# Patient Record
Sex: Male | Born: 1985 | Race: White | Hispanic: No | Marital: Married | Smoking: Current every day smoker
Health system: Southern US, Community
[De-identification: ages and names within clinical notes are randomized; demographics above are authoritative.]

## PROBLEM LIST (undated history)

## (undated) HISTORY — PX: KNEE ARTHROSCOPY: SUR90

---

## 2008-11-20 ENCOUNTER — Encounter: Admission: RE | Admit: 2008-11-20 | Discharge: 2008-11-20 | Payer: Self-pay | Admitting: Occupational Medicine

## 2015-01-18 ENCOUNTER — Encounter (HOSPITAL_COMMUNITY): Payer: Self-pay | Admitting: *Deleted

## 2015-01-18 ENCOUNTER — Emergency Department (HOSPITAL_COMMUNITY)
Admission: EM | Admit: 2015-01-18 | Discharge: 2015-01-18 | Disposition: A | Discharge status: Home / Self Care | Payer: BLUE CROSS/BLUE SHIELD | Attending: Emergency Medicine | Admitting: Emergency Medicine

## 2015-01-18 ENCOUNTER — Emergency Department (HOSPITAL_COMMUNITY): Payer: BLUE CROSS/BLUE SHIELD

## 2015-01-18 DIAGNOSIS — R519 Headache, unspecified: Secondary | ICD-10-CM

## 2015-01-18 DIAGNOSIS — R42 Dizziness and giddiness: Secondary | ICD-10-CM | POA: Insufficient documentation

## 2015-01-18 DIAGNOSIS — Z720 Tobacco use: Secondary | ICD-10-CM | POA: Diagnosis not present

## 2015-01-18 DIAGNOSIS — F419 Anxiety disorder, unspecified: Secondary | ICD-10-CM | POA: Diagnosis not present

## 2015-01-18 DIAGNOSIS — R51 Headache: Secondary | ICD-10-CM | POA: Diagnosis not present

## 2015-01-18 NOTE — Discharge Instructions (Signed)
Dizziness °Dizziness is a common problem. It is a feeling of unsteadiness or light-headedness. You may feel like you are about to faint. Dizziness can lead to injury if you stumble or fall. Anyone can become dizzy, but dizziness is more common in older adults. This condition can be caused by a number of things, including medicines, dehydration, or illness. °HOME CARE INSTRUCTIONS °Taking these steps may help with your condition: °Eating and Drinking °· Drink enough fluid to keep your urine clear or pale yellow. This helps to keep you from becoming dehydrated. Try to drink more clear fluids, such as water. °· Do not drink alcohol. °· Limit your caffeine intake if directed by your health care provider. °· Limit your salt intake if directed by your health care provider. °Activity °· Avoid making quick movements. °¨ Rise slowly from chairs and steady yourself until you feel okay. °¨ In the morning, first sit up on the side of the bed. When you feel okay, stand slowly while you hold onto something until you know that your balance is fine. °· Move your legs often if you need to stand in one place for a long time. Tighten and relax your muscles in your legs while you are standing. °· Do not drive or operate heavy machinery if you feel dizzy. °· Avoid bending down if you feel dizzy. Place items in your home so that they are easy for you to reach without leaning over. °Lifestyle °· Do not use any tobacco products, including cigarettes, chewing tobacco, or electronic cigarettes. If you need help quitting, ask your health care provider. °· Try to reduce your stress level, such as with yoga or meditation. Talk with your health care provider if you need help. °General Instructions °· Watch your dizziness for any changes. °· Take medicines only as directed by your health care provider. Talk with your health care provider if you think that your dizziness is caused by a medicine that you are taking. °· Tell a friend or a family  member that you are feeling dizzy. If he or she notices any changes in your behavior, have this person call your health care provider. °· Keep all follow-up visits as directed by your health care provider. This is important. °SEEK MEDICAL CARE IF: °· Your dizziness does not go away. °· Your dizziness or light-headedness gets worse. °· You feel nauseous. °· You have reduced hearing. °· You have new symptoms. °· You are unsteady on your feet or you feel like the room is spinning. °SEEK IMMEDIATE MEDICAL CARE IF: °· You vomit or have diarrhea and are unable to eat or drink anything. °· You have problems talking, walking, swallowing, or using your arms, hands, or legs. °· You feel generally weak. °· You are not thinking clearly or you have trouble forming sentences. It may take a friend or family member to notice this. °· You have chest pain, abdominal pain, shortness of breath, or sweating. °· Your vision changes. °· You notice any bleeding. °· You have a headache. °· You have neck pain or a stiff neck. °· You have a fever. °  °This information is not intended to replace advice given to you by your health care provider. Make sure you discuss any questions you have with your health care provider. °  °Document Released: 09/22/2000 Document Revised: 08/13/2014 Document Reviewed: 03/25/2014 °Elsevier Interactive Patient Education ©2016 [employer] °General Headache Without Cause °A headache is pain or discomfort felt around the head or neck area. The specific   cause of a headache may not be found. There are many causes and types of headaches. A few common ones are: °· Tension headaches. °· Migraine headaches. °· Cluster headaches. °· Chronic daily headaches. °HOME CARE INSTRUCTIONS  °Watch your condition for any changes. Take these steps to help with your condition: °Managing Pain °· Take over-the-counter and prescription medicines only as told by your health care provider. °· Lie down in a dark, quiet room when you have  a headache. °· If directed, apply ice to the head and neck area: °¨ Put ice in a plastic bag. °¨ Place a towel between your skin and the bag. °¨ Leave the ice on for 20 minutes, 2-3 times per day. °· Use a heating pad or hot shower to apply heat to the head and neck area as told by your health care provider. °· Keep lights dim if bright lights bother you or make your headaches worse. °Eating and Drinking °· Eat meals on a regular schedule. °· Limit alcohol use. °· Decrease the amount of caffeine you drink, or stop drinking caffeine. °General Instructions °· Keep all follow-up visits as told by your health care provider. This is important. °· Keep a headache journal to help find out what may trigger your headaches. For example, write down: °¨ What you eat and drink. °¨ How much sleep you get. °¨ Any change to your diet or medicines. °· Try massage or other relaxation techniques. °· Limit stress. °· Sit up straight, and do not tense your muscles. °· Do not use tobacco products, including cigarettes, chewing tobacco, or e-cigarettes. If you need help quitting, ask your health care provider. °· Exercise regularly as told by your health care provider. °· Sleep on a regular schedule. Get 7-9 hours of sleep, or the amount recommended by your health care provider. °SEEK MEDICAL CARE IF:  °· Your symptoms are not helped by medicine. °· You have a headache that is different from the usual headache. °· You have nausea or you vomit. °· You have a fever. °SEEK IMMEDIATE MEDICAL CARE IF:  °· Your headache becomes severe. °· You have repeated vomiting. °· You have a stiff neck. °· You have a loss of vision. °· You have problems with speech. °· You have pain in the eye or ear. °· You have muscular weakness or loss of muscle control. °· You lose your balance or have trouble walking. °· You feel faint or pass out. °· You have confusion. °  °This information is not intended to replace advice given to you by your health care provider.  Make sure you discuss any questions you have with your health care provider. °  °Document Released: 03/29/2005 Document Revised: 12/18/2014 Document Reviewed: 07/22/2014 °Elsevier Interactive Patient Education ©2016 [employer] ° °

## 2015-01-18 NOTE — ED Notes (Signed)
Pt reports starting Wed he developed dizziness, tightness in his head, temperol area sore, nausea, photophobia and some nausea. Pt is having difficulty focusing, did voice concern regarding wedding next weekend.

## 2015-01-18 NOTE — ED Provider Notes (Signed)
CSN: [id number]     Arrival date & time 01/18/15  1329 History   First MD Initiated Contact with Patient 01/18/15 1501     Chief Complaint  Patient presents with  . Dizziness  . photobia   . Anxiety     (Consider location/radiation/quality/duration/timing/severity/associated sxs/prior Treatment) HPI   29 year old male with multiple complaints. Complaining of headache. Describes a soreness in bilateral temporal region. Associated with intermittent dizziness. Describes sensation as if he may pass out. He feels like he is in a fog. Some difficulty with paying attention. Mild loss appear no vomiting. Some photophobia. No acute visual changes otherwise. No history of similar type symptoms. He denies any trauma. No blood thinners. Has tried taking ibuprofen with some mild relief. Otherwise healthy.  History reviewed. No pertinent past medical history. Past Surgical History  Procedure Laterality Date  . Knee arthroscopy     No family history on file. Social History  Substance Use Topics  . Smoking status: Current Every Day Smoker  . Smokeless tobacco: None  . Alcohol Use: Yes    Review of Systems  All systems reviewed and negative, other than as noted in HPI.   Allergies  Poison ivy extract and Bactrim  Home Medications   Prior to Admission medications   Medication Sig Start Date End Date Taking? Authorizing Provider  ibuprofen (ADVIL,MOTRIN) 200 MG tablet Take 400 mg by mouth every 6 (six) hours as needed for fever, headache, mild pain, moderate pain or cramping.   Yes Historical Provider, MD  naproxen sodium (ANAPROX) 220 MG tablet Take 220 mg by mouth every 12 (twelve) hours as needed (for pain).   Yes Historical Provider, MD   BP 126/74 mmHg  Pulse 68  Temp(Src) 97.9 F (36.6 C) (Oral)  Resp 20  SpO2 96% Physical Exam  Constitutional: He is oriented to person, place, and time. He appears well-developed and well-nourished. No distress.  HENT:  Head: Normocephalic and  atraumatic.  Eyes: Conjunctivae and EOM are normal. Pupils are equal, round, and reactive to light. Right eye exhibits no discharge. Left eye exhibits no discharge.  Neck: Normal range of motion. Neck supple.  No nuchal rigidity  Cardiovascular: Normal rate, regular rhythm and normal heart sounds.  Exam reveals no gallop and no friction rub.   No murmur heard. Pulmonary/Chest: Effort normal and breath sounds normal. No respiratory distress.  Abdominal: Soft. He exhibits no distension. There is no tenderness.  Musculoskeletal: He exhibits no edema or tenderness.  Neurological: He is alert and oriented to person, place, and time. No cranial nerve deficit. He exhibits normal muscle tone. Coordination normal.  Speech clear. Content appropriate. Good finger nose testing bilaterally. Gait steady.  Skin: Skin is warm and dry. He is not diaphoretic.  Psychiatric: He has a normal mood and affect. His behavior is normal. Thought content normal.  Nursing note and vitals reviewed.   ED Course  Procedures (including critical care time) Labs Review Labs Reviewed - No data to display  Imaging Review Ct Head Wo Contrast  01/18/2015   CLINICAL DATA:  Three days get developed dizziness and lightheaded feeling with nausea and photophobia.  EXAM: CT HEAD WITHOUT CONTRAST  TECHNIQUE: Contiguous axial images were obtained from the base of the skull through the vertex without intravenous contrast.  COMPARISON:  None.  FINDINGS: The brain has a normal appearance without evidence of malformation, atrophy, old or acute infarction, mass lesion, hemorrhage, hydrocephalus or extra-axial collection. The calvarium is unremarkable. The paranasal sinuses, middle ears  and mastoids are clear.  IMPRESSION: Normal head CT   Electronically Signed   By: Paulina Fusi M.D.   On: 01/18/2015 16:24   I have personally reviewed and evaluated these images and lab results as part of my medical decision-making.   EKG  Interpretation   Date/Time:  Saturday January 18 2015 13:38:36 EDT Ventricular Rate:  71 PR Interval:  190 QRS Duration: 87 QT Interval:  386 QTC Calculation: 419 R Axis:   96 Text Interpretation:  Sinus rhythm Borderline right axis deviation  Baseline wander in lead(s) V2 No old tracing to compare Confirmed by Kaicee Scarpino   MD, Roslyn Else ([id number]) on 01/18/2015 3:05:32 PM      MDM   Final diagnoses:  Nonintractable headache, unspecified chronicity pattern, unspecified headache type  Dizziness and giddiness    29yM with headache and several vague symptoms. Does not clearly fit common headache pattern. Actually currently symptom free. Nonfocal neuro exam. Normal head CT. Getting married in next week. May be some component of stress/anxiety. Low suspicion for emergent process such as bleed, mass, infectious, CO poisoning, glaucoma, etc. It has been determined that no acute conditions requiring further emergency intervention are present at this time. The patient has been advised of the diagnosis and plan. I reviewed any labs and imaging including any potential incidental findings. We have discussed signs and symptoms that warrant return to the ED and they are listed in the discharge instructions.      Raeford Razor, MD 01/23/15 1110

## 2015-01-18 NOTE — ED Notes (Signed)
Pt calm during assessment, states [facility] and [facility] could not figure out what is wrong.

## 2015-10-15 DIAGNOSIS — J029 Acute pharyngitis, unspecified: Secondary | ICD-10-CM | POA: Diagnosis not present

## 2015-10-15 DIAGNOSIS — J018 Other acute sinusitis: Secondary | ICD-10-CM | POA: Diagnosis not present

## 2015-10-21 DIAGNOSIS — M9902 Segmental and somatic dysfunction of thoracic region: Secondary | ICD-10-CM | POA: Diagnosis not present

## 2015-10-21 DIAGNOSIS — M9903 Segmental and somatic dysfunction of lumbar region: Secondary | ICD-10-CM | POA: Diagnosis not present

## 2015-10-21 DIAGNOSIS — M9901 Segmental and somatic dysfunction of cervical region: Secondary | ICD-10-CM | POA: Diagnosis not present

## 2015-10-21 DIAGNOSIS — M542 Cervicalgia: Secondary | ICD-10-CM | POA: Diagnosis not present

## 2015-11-18 DIAGNOSIS — M542 Cervicalgia: Secondary | ICD-10-CM | POA: Diagnosis not present

## 2015-11-18 DIAGNOSIS — M9902 Segmental and somatic dysfunction of thoracic region: Secondary | ICD-10-CM | POA: Diagnosis not present

## 2015-11-18 DIAGNOSIS — M9901 Segmental and somatic dysfunction of cervical region: Secondary | ICD-10-CM | POA: Diagnosis not present

## 2015-11-18 DIAGNOSIS — M9903 Segmental and somatic dysfunction of lumbar region: Secondary | ICD-10-CM | POA: Diagnosis not present

## 2015-12-23 DIAGNOSIS — M9902 Segmental and somatic dysfunction of thoracic region: Secondary | ICD-10-CM | POA: Diagnosis not present

## 2015-12-23 DIAGNOSIS — M9903 Segmental and somatic dysfunction of lumbar region: Secondary | ICD-10-CM | POA: Diagnosis not present

## 2015-12-23 DIAGNOSIS — M9901 Segmental and somatic dysfunction of cervical region: Secondary | ICD-10-CM | POA: Diagnosis not present

## 2015-12-23 DIAGNOSIS — M542 Cervicalgia: Secondary | ICD-10-CM | POA: Diagnosis not present

## 2016-01-27 DIAGNOSIS — M9901 Segmental and somatic dysfunction of cervical region: Secondary | ICD-10-CM | POA: Diagnosis not present

## 2016-01-27 DIAGNOSIS — M9902 Segmental and somatic dysfunction of thoracic region: Secondary | ICD-10-CM | POA: Diagnosis not present

## 2016-01-27 DIAGNOSIS — M9903 Segmental and somatic dysfunction of lumbar region: Secondary | ICD-10-CM | POA: Diagnosis not present

## 2016-01-27 DIAGNOSIS — M542 Cervicalgia: Secondary | ICD-10-CM | POA: Diagnosis not present

## 2016-02-17 DIAGNOSIS — M533 Sacrococcygeal disorders, not elsewhere classified: Secondary | ICD-10-CM | POA: Diagnosis not present

## 2016-02-26 DIAGNOSIS — M9903 Segmental and somatic dysfunction of lumbar region: Secondary | ICD-10-CM | POA: Diagnosis not present

## 2016-02-26 DIAGNOSIS — M9901 Segmental and somatic dysfunction of cervical region: Secondary | ICD-10-CM | POA: Diagnosis not present

## 2016-02-26 DIAGNOSIS — M542 Cervicalgia: Secondary | ICD-10-CM | POA: Diagnosis not present

## 2016-02-26 DIAGNOSIS — M9902 Segmental and somatic dysfunction of thoracic region: Secondary | ICD-10-CM | POA: Diagnosis not present

## 2016-07-31 IMAGING — CT CT HEAD W/O CM
2 series · 17 of 30 positions shown, 20 images · non-contrast
Comparison: None.

CLINICAL DATA: Three days get developed dizziness and lightheaded
feeling with nausea and photophobia.

EXAM:
CT HEAD WITHOUT CONTRAST
TECHNIQUE: Contiguous axial images were obtained from the base of the skull
through the vertex without intravenous contrast.

[Series 2: head w/o · axial · non-contrast · 0.45mm/px · z∈[-131,-11]mm · 9 of 31 slices shown, 12 images]
[im 4/31  brain]
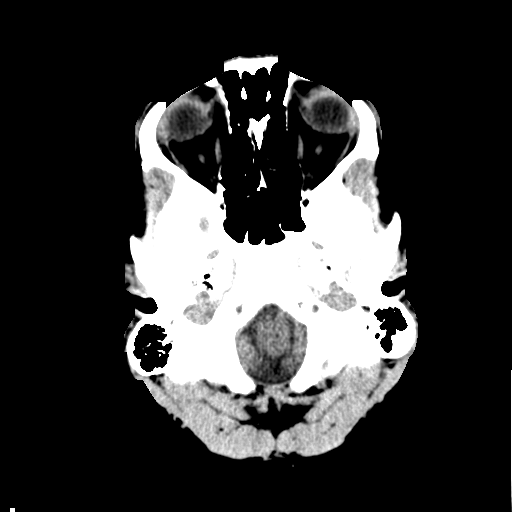
[im 4/31  bone]
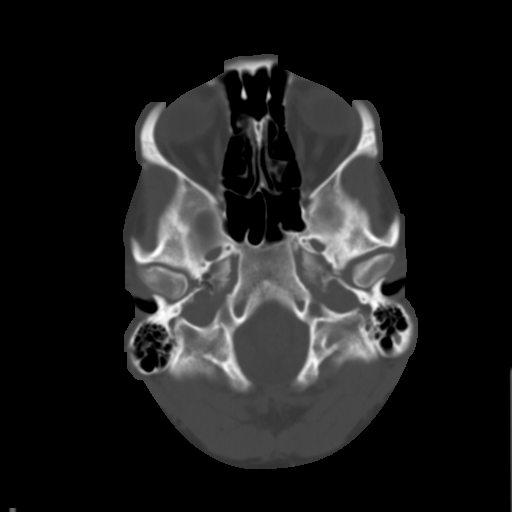
[im 7/31  brain]
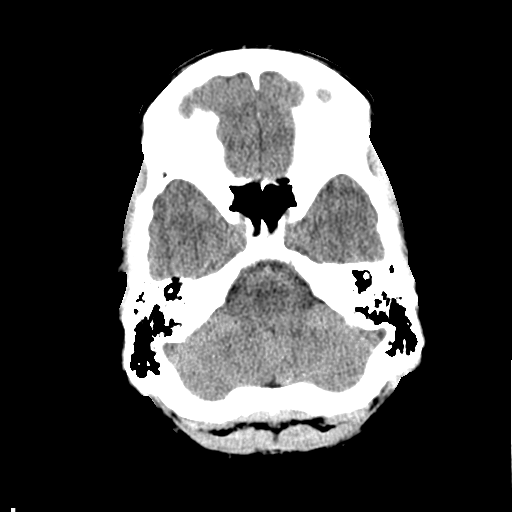
[im 10/31  brain]
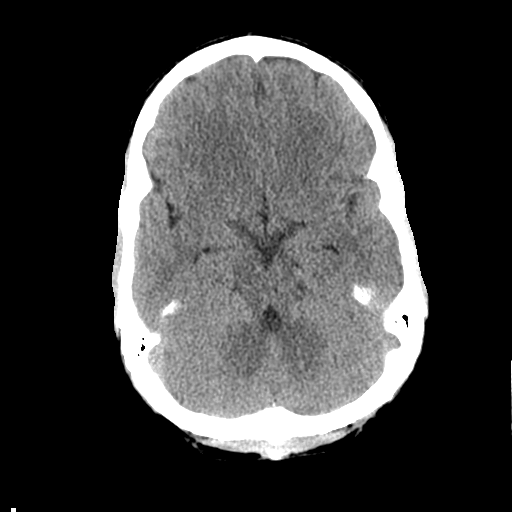
[im 13/31  brain]
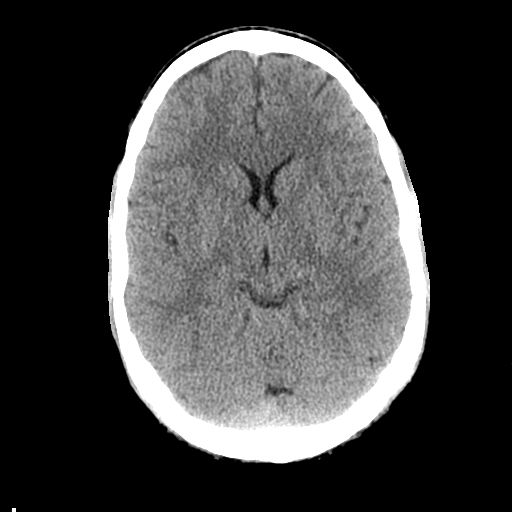
[im 16/31  brain]
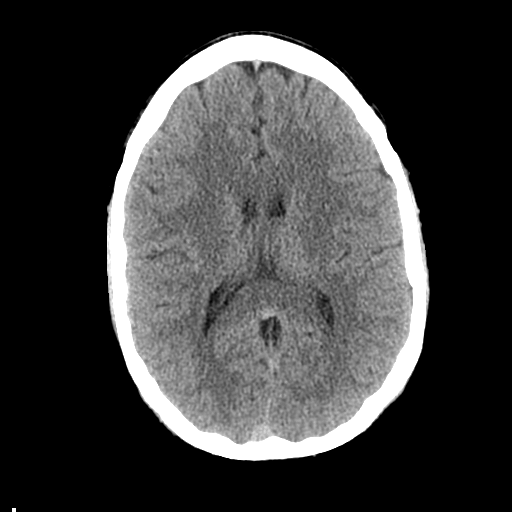
[im 16/31  bone]
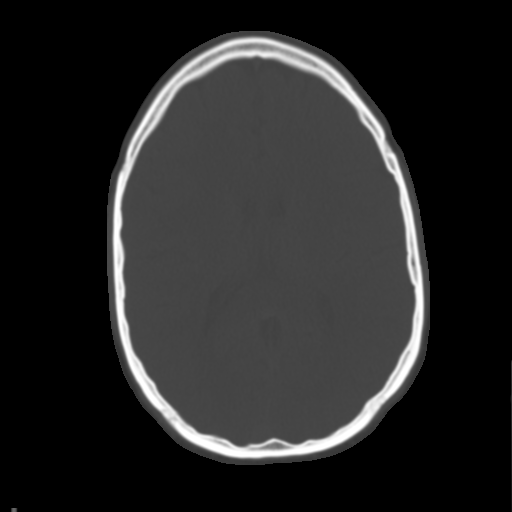
[im 19/31  brain]
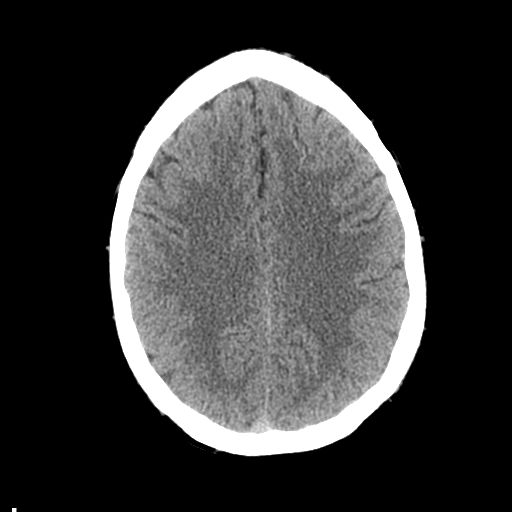
[im 22/31  brain]
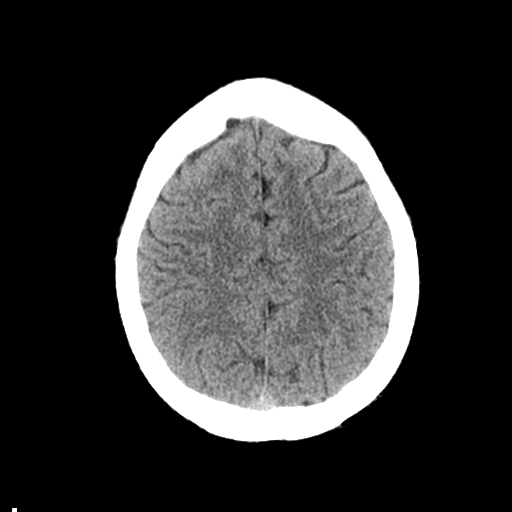
[im 25/31  brain]
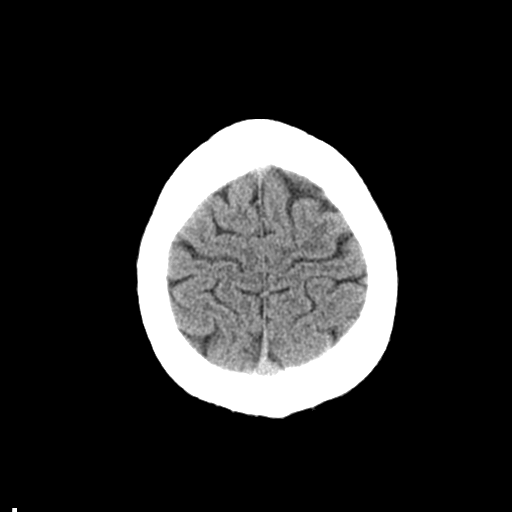
[im 28/31  brain]
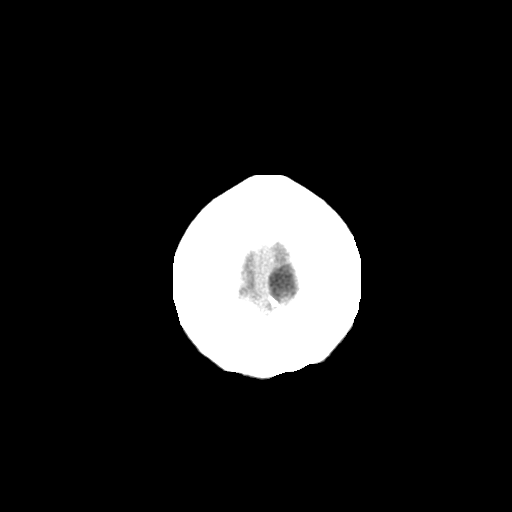
[im 28/31  bone]
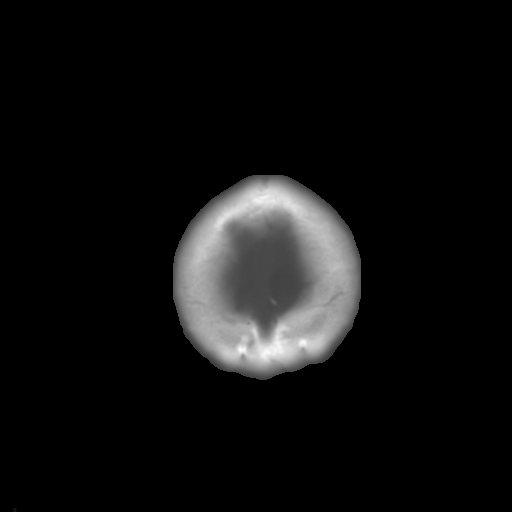

[Series 3: bone windows · axial · 0.45mm/px · z∈[-131,-14]mm · 8 of 51 slices shown]
[im 6/51  bone]
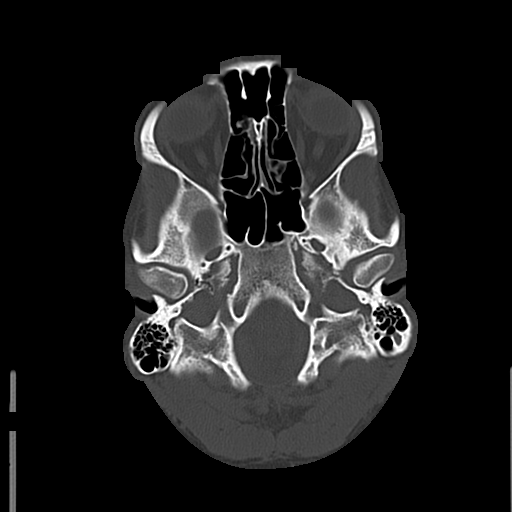
[im 12/51  bone]
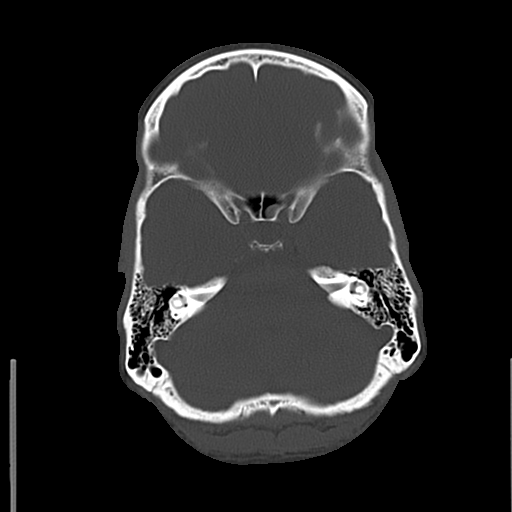
[im 17/51  bone]
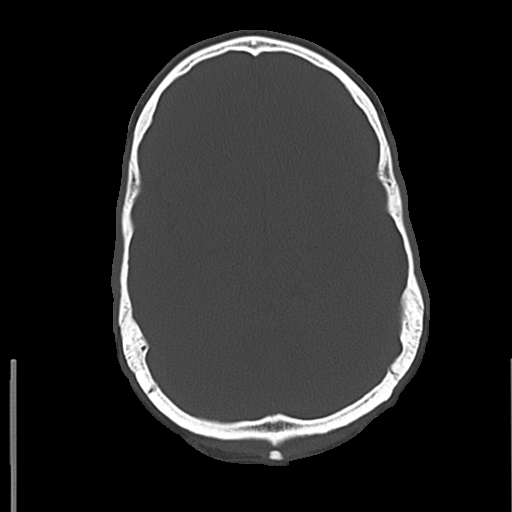
[im 23/51  bone]
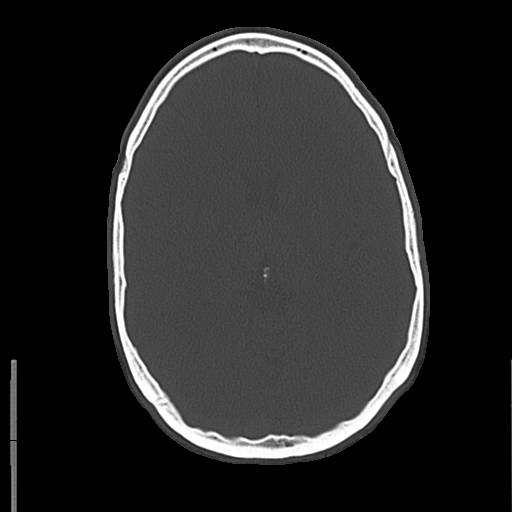
[im 28/51  bone]
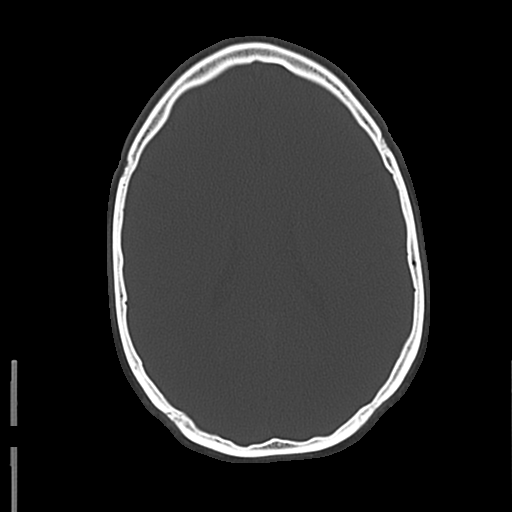
[im 34/51  bone]
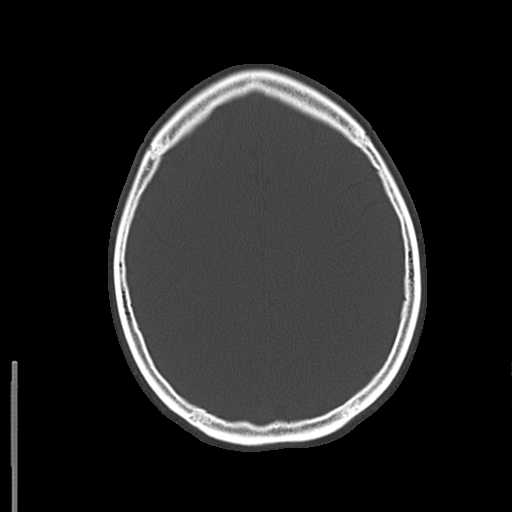
[im 39/51  bone]
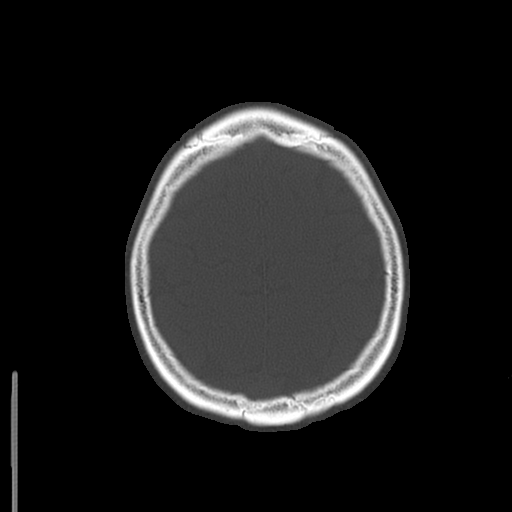
[im 45/51  bone]
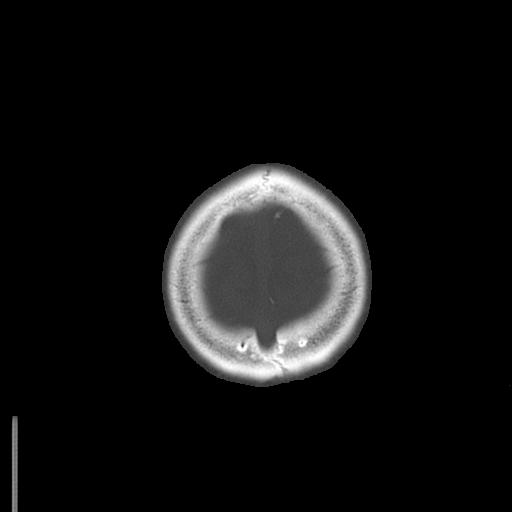

[17 of 30 positions shown; findings below may reference images not displayed]

FINDINGS: The brain has a normal appearance without evidence of malformation,
atrophy, old or acute infarction, mass lesion, hemorrhage,
hydrocephalus or extra-axial collection. The calvarium is
unremarkable. The paranasal sinuses, middle ears and mastoids are
clear.
IMPRESSION: Normal head CT

## 2016-08-31 DIAGNOSIS — M9903 Segmental and somatic dysfunction of lumbar region: Secondary | ICD-10-CM | POA: Diagnosis not present

## 2016-08-31 DIAGNOSIS — S76101A Unspecified injury of right quadriceps muscle, fascia and tendon, initial encounter: Secondary | ICD-10-CM | POA: Diagnosis not present

## 2016-08-31 DIAGNOSIS — M791 Myalgia: Secondary | ICD-10-CM | POA: Diagnosis not present

## 2016-08-31 DIAGNOSIS — M9905 Segmental and somatic dysfunction of pelvic region: Secondary | ICD-10-CM | POA: Diagnosis not present

## 2016-08-31 DIAGNOSIS — M9902 Segmental and somatic dysfunction of thoracic region: Secondary | ICD-10-CM | POA: Diagnosis not present

## 2016-09-03 DIAGNOSIS — R1031 Right lower quadrant pain: Secondary | ICD-10-CM | POA: Diagnosis not present

## 2016-09-07 DIAGNOSIS — S76101A Unspecified injury of right quadriceps muscle, fascia and tendon, initial encounter: Secondary | ICD-10-CM | POA: Diagnosis not present

## 2016-09-07 DIAGNOSIS — M9902 Segmental and somatic dysfunction of thoracic region: Secondary | ICD-10-CM | POA: Diagnosis not present

## 2016-09-07 DIAGNOSIS — M9905 Segmental and somatic dysfunction of pelvic region: Secondary | ICD-10-CM | POA: Diagnosis not present

## 2016-09-07 DIAGNOSIS — M791 Myalgia: Secondary | ICD-10-CM | POA: Diagnosis not present

## 2016-09-07 DIAGNOSIS — M9903 Segmental and somatic dysfunction of lumbar region: Secondary | ICD-10-CM | POA: Diagnosis not present

## 2016-09-14 DIAGNOSIS — M9902 Segmental and somatic dysfunction of thoracic region: Secondary | ICD-10-CM | POA: Diagnosis not present

## 2016-09-14 DIAGNOSIS — M791 Myalgia: Secondary | ICD-10-CM | POA: Diagnosis not present

## 2016-09-14 DIAGNOSIS — M9903 Segmental and somatic dysfunction of lumbar region: Secondary | ICD-10-CM | POA: Diagnosis not present

## 2016-09-14 DIAGNOSIS — S76101A Unspecified injury of right quadriceps muscle, fascia and tendon, initial encounter: Secondary | ICD-10-CM | POA: Diagnosis not present

## 2016-09-14 DIAGNOSIS — M9905 Segmental and somatic dysfunction of pelvic region: Secondary | ICD-10-CM | POA: Diagnosis not present

## 2016-09-16 DIAGNOSIS — Z23 Encounter for immunization: Secondary | ICD-10-CM | POA: Diagnosis not present

## 2016-09-16 DIAGNOSIS — Z13220 Encounter for screening for lipoid disorders: Secondary | ICD-10-CM | POA: Diagnosis not present

## 2016-09-16 DIAGNOSIS — Z0000 Encounter for general adult medical examination without abnormal findings: Secondary | ICD-10-CM | POA: Diagnosis not present

## 2016-09-22 DIAGNOSIS — M9905 Segmental and somatic dysfunction of pelvic region: Secondary | ICD-10-CM | POA: Diagnosis not present

## 2016-09-22 DIAGNOSIS — M9902 Segmental and somatic dysfunction of thoracic region: Secondary | ICD-10-CM | POA: Diagnosis not present

## 2016-09-22 DIAGNOSIS — S76101A Unspecified injury of right quadriceps muscle, fascia and tendon, initial encounter: Secondary | ICD-10-CM | POA: Diagnosis not present

## 2016-09-22 DIAGNOSIS — M9903 Segmental and somatic dysfunction of lumbar region: Secondary | ICD-10-CM | POA: Diagnosis not present

## 2016-11-11 DIAGNOSIS — Z23 Encounter for immunization: Secondary | ICD-10-CM | POA: Diagnosis not present

## 2016-12-15 DIAGNOSIS — S76101A Unspecified injury of right quadriceps muscle, fascia and tendon, initial encounter: Secondary | ICD-10-CM | POA: Diagnosis not present

## 2016-12-15 DIAGNOSIS — M9903 Segmental and somatic dysfunction of lumbar region: Secondary | ICD-10-CM | POA: Diagnosis not present

## 2016-12-15 DIAGNOSIS — M9905 Segmental and somatic dysfunction of pelvic region: Secondary | ICD-10-CM | POA: Diagnosis not present

## 2016-12-15 DIAGNOSIS — M9902 Segmental and somatic dysfunction of thoracic region: Secondary | ICD-10-CM | POA: Diagnosis not present

## 2017-02-10 DIAGNOSIS — M9905 Segmental and somatic dysfunction of pelvic region: Secondary | ICD-10-CM | POA: Diagnosis not present

## 2017-02-10 DIAGNOSIS — M9902 Segmental and somatic dysfunction of thoracic region: Secondary | ICD-10-CM | POA: Diagnosis not present

## 2017-02-10 DIAGNOSIS — M9903 Segmental and somatic dysfunction of lumbar region: Secondary | ICD-10-CM | POA: Diagnosis not present

## 2017-02-10 DIAGNOSIS — S76101A Unspecified injury of right quadriceps muscle, fascia and tendon, initial encounter: Secondary | ICD-10-CM | POA: Diagnosis not present

## 2017-02-28 DIAGNOSIS — M9903 Segmental and somatic dysfunction of lumbar region: Secondary | ICD-10-CM | POA: Diagnosis not present

## 2017-02-28 DIAGNOSIS — M9905 Segmental and somatic dysfunction of pelvic region: Secondary | ICD-10-CM | POA: Diagnosis not present

## 2017-02-28 DIAGNOSIS — M9901 Segmental and somatic dysfunction of cervical region: Secondary | ICD-10-CM | POA: Diagnosis not present

## 2017-02-28 DIAGNOSIS — M9902 Segmental and somatic dysfunction of thoracic region: Secondary | ICD-10-CM | POA: Diagnosis not present

## 2017-04-19 DIAGNOSIS — M9905 Segmental and somatic dysfunction of pelvic region: Secondary | ICD-10-CM | POA: Diagnosis not present

## 2017-04-19 DIAGNOSIS — M9901 Segmental and somatic dysfunction of cervical region: Secondary | ICD-10-CM | POA: Diagnosis not present

## 2017-04-19 DIAGNOSIS — M9903 Segmental and somatic dysfunction of lumbar region: Secondary | ICD-10-CM | POA: Diagnosis not present

## 2017-04-19 DIAGNOSIS — M9902 Segmental and somatic dysfunction of thoracic region: Secondary | ICD-10-CM | POA: Diagnosis not present

## 2017-04-25 DIAGNOSIS — M9901 Segmental and somatic dysfunction of cervical region: Secondary | ICD-10-CM | POA: Diagnosis not present

## 2017-04-25 DIAGNOSIS — M9902 Segmental and somatic dysfunction of thoracic region: Secondary | ICD-10-CM | POA: Diagnosis not present

## 2017-04-25 DIAGNOSIS — M9905 Segmental and somatic dysfunction of pelvic region: Secondary | ICD-10-CM | POA: Diagnosis not present

## 2017-04-25 DIAGNOSIS — M9903 Segmental and somatic dysfunction of lumbar region: Secondary | ICD-10-CM | POA: Diagnosis not present

## 2017-04-28 DIAGNOSIS — R0602 Shortness of breath: Secondary | ICD-10-CM | POA: Diagnosis not present

## 2017-04-28 DIAGNOSIS — R062 Wheezing: Secondary | ICD-10-CM | POA: Diagnosis not present

## 2017-08-03 DIAGNOSIS — M9901 Segmental and somatic dysfunction of cervical region: Secondary | ICD-10-CM | POA: Diagnosis not present

## 2017-08-03 DIAGNOSIS — M9905 Segmental and somatic dysfunction of pelvic region: Secondary | ICD-10-CM | POA: Diagnosis not present

## 2017-08-03 DIAGNOSIS — M9903 Segmental and somatic dysfunction of lumbar region: Secondary | ICD-10-CM | POA: Diagnosis not present

## 2017-08-03 DIAGNOSIS — M9902 Segmental and somatic dysfunction of thoracic region: Secondary | ICD-10-CM | POA: Diagnosis not present

## 2017-11-11 DIAGNOSIS — Z31440 Encounter of male for testing for genetic disease carrier status for procreative management: Secondary | ICD-10-CM | POA: Diagnosis not present

## 2017-12-14 DIAGNOSIS — Z136 Encounter for screening for cardiovascular disorders: Secondary | ICD-10-CM | POA: Diagnosis not present

## 2017-12-14 DIAGNOSIS — Z0000 Encounter for general adult medical examination without abnormal findings: Secondary | ICD-10-CM | POA: Diagnosis not present

## 2018-09-13 DIAGNOSIS — Z716 Tobacco abuse counseling: Secondary | ICD-10-CM | POA: Diagnosis not present

## 2018-09-13 DIAGNOSIS — M62838 Other muscle spasm: Secondary | ICD-10-CM | POA: Diagnosis not present

## 2018-09-13 DIAGNOSIS — B360 Pityriasis versicolor: Secondary | ICD-10-CM | POA: Diagnosis not present

## 2018-09-13 DIAGNOSIS — F17210 Nicotine dependence, cigarettes, uncomplicated: Secondary | ICD-10-CM | POA: Diagnosis not present

## 2018-09-13 DIAGNOSIS — Z0000 Encounter for general adult medical examination without abnormal findings: Secondary | ICD-10-CM | POA: Diagnosis not present

## 2018-09-13 DIAGNOSIS — Z13220 Encounter for screening for lipoid disorders: Secondary | ICD-10-CM | POA: Diagnosis not present

## 2018-11-15 DIAGNOSIS — M71571 Other bursitis, not elsewhere classified, right ankle and foot: Secondary | ICD-10-CM | POA: Diagnosis not present

## 2018-11-15 DIAGNOSIS — M2041 Other hammer toe(s) (acquired), right foot: Secondary | ICD-10-CM | POA: Diagnosis not present

## 2018-11-29 DIAGNOSIS — M2041 Other hammer toe(s) (acquired), right foot: Secondary | ICD-10-CM | POA: Diagnosis not present

## 2018-12-06 ENCOUNTER — Ambulatory Visit: Payer: BC Managed Care – PPO | Admitting: Podiatry

## 2019-06-22 ENCOUNTER — Ambulatory Visit: Payer: Self-pay | Attending: Internal Medicine

## 2019-06-22 DIAGNOSIS — Z23 Encounter for immunization: Secondary | ICD-10-CM

## 2019-06-22 NOTE — Progress Notes (Signed)
   Covid-19 Vaccination Clinic  Name:  Lance Chavez    MRN: [id number] DOB: March 02, 1986  06/22/2019  Mr. Kendrix was observed post Covid-19 immunization for 30 minutes based on pre-vaccination screening without incident. He was provided with Vaccine Information Sheet and instruction to access the V-Safe system.   Mr. Vanatta was instructed to call 911 with any severe reactions post vaccine: Marland Kitchen Difficulty breathing  . Swelling of face and throat  . A fast heartbeat  . A bad rash all over body  . Dizziness and weakness   Immunizations Administered    Name Date Dose VIS Date Route   Pfizer COVID-19 Vaccine 06/22/2019 11:33 AM 0.3 mL 03/23/2019 Intramuscular   Manufacturer: [employer], [employer]   Lot: [id number]   NDC: [address]-3

## 2019-07-16 ENCOUNTER — Ambulatory Visit: Payer: Self-pay | Attending: Internal Medicine

## 2019-07-16 DIAGNOSIS — Z23 Encounter for immunization: Secondary | ICD-10-CM

## 2019-07-16 NOTE — Progress Notes (Signed)
   Covid-19 Vaccination Clinic  Name:  Denney Shein    MRN: [id number] DOB: 08/01/1985  07/16/2019  Mr. Kimball was observed post Covid-19 immunization for 15 minutes without incident. He was provided with Vaccine Information Sheet and instruction to access the V-Safe system.   Mr. Sangiovanni was instructed to call 911 with any severe reactions post vaccine: Marland Kitchen Difficulty breathing  . Swelling of face and throat  . A fast heartbeat  . A bad rash all over body  . Dizziness and weakness   Immunizations Administered    Name Date Dose VIS Date Route   Pfizer COVID-19 Vaccine 07/16/2019  2:55 PM 0.3 mL 03/23/2019 Intramuscular   Manufacturer: [employer], [employer]   Lot: [id number]   NDC: [address]-6

## 2022-01-12 ENCOUNTER — Other Ambulatory Visit: Payer: Self-pay | Admitting: Orthopedic Surgery

## 2022-01-12 DIAGNOSIS — G8929 Other chronic pain: Secondary | ICD-10-CM

## 2022-01-26 ENCOUNTER — Ambulatory Visit
Admission: RE | Admit: 2022-01-26 | Discharge: 2022-01-26 | Disposition: A | Discharge status: Home / Self Care | Payer: BC Managed Care – PPO | Source: Ambulatory Visit | Attending: Orthopedic Surgery | Admitting: Orthopedic Surgery

## 2022-01-26 DIAGNOSIS — G8929 Other chronic pain: Secondary | ICD-10-CM
